# Patient Record
Sex: Male | Born: 1960 | Race: White | Hispanic: No | Marital: Single | Smoking: Current every day smoker
Health system: Southern US, Community
[De-identification: ages and names within clinical notes are randomized; demographics above are authoritative.]

## PROBLEM LIST (undated history)

## (undated) DIAGNOSIS — M1990 Unspecified osteoarthritis, unspecified site: Secondary | ICD-10-CM

## (undated) DIAGNOSIS — G8929 Other chronic pain: Secondary | ICD-10-CM

## (undated) DIAGNOSIS — J9819 Other pulmonary collapse: Secondary | ICD-10-CM

## (undated) DIAGNOSIS — M549 Dorsalgia, unspecified: Secondary | ICD-10-CM

## (undated) HISTORY — PX: APPENDECTOMY: SHX54

---

## 2008-12-29 ENCOUNTER — Emergency Department (HOSPITAL_COMMUNITY): Admission: EM | Admit: 2008-12-29 | Discharge: 2008-12-29 | Payer: Self-pay | Admitting: Emergency Medicine

## 2009-05-19 ENCOUNTER — Emergency Department (HOSPITAL_COMMUNITY): Admission: EM | Admit: 2009-05-19 | Discharge: 2009-05-19 | Payer: Self-pay | Admitting: Emergency Medicine

## 2009-08-05 ENCOUNTER — Emergency Department (HOSPITAL_COMMUNITY): Admission: EM | Admit: 2009-08-05 | Discharge: 2009-08-05 | Payer: Self-pay | Admitting: Emergency Medicine
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
## 2013-04-22 ENCOUNTER — Encounter (HOSPITAL_COMMUNITY): Payer: Self-pay | Admitting: Emergency Medicine

## 2013-04-22 ENCOUNTER — Emergency Department (HOSPITAL_COMMUNITY)
Admission: EM | Admit: 2013-04-22 | Discharge: 2013-04-22 | Disposition: A | Discharge status: Home / Self Care | Payer: Self-pay | Attending: Emergency Medicine | Admitting: Emergency Medicine

## 2013-04-22 DIAGNOSIS — F17200 Nicotine dependence, unspecified, uncomplicated: Secondary | ICD-10-CM | POA: Insufficient documentation

## 2013-04-22 DIAGNOSIS — H5789 Other specified disorders of eye and adnexa: Secondary | ICD-10-CM | POA: Insufficient documentation

## 2013-04-22 DIAGNOSIS — H109 Unspecified conjunctivitis: Secondary | ICD-10-CM | POA: Insufficient documentation

## 2013-04-22 DIAGNOSIS — Z8709 Personal history of other diseases of the respiratory system: Secondary | ICD-10-CM | POA: Insufficient documentation

## 2013-04-22 DIAGNOSIS — Z8739 Personal history of other diseases of the musculoskeletal system and connective tissue: Secondary | ICD-10-CM | POA: Insufficient documentation

## 2013-04-22 HISTORY — DX: Other pulmonary collapse: J98.19

## 2013-04-22 HISTORY — DX: Unspecified osteoarthritis, unspecified site: M19.90

## 2013-04-22 MED ORDER — TOBRAMYCIN 0.3 % OP SOLN: 2.0000 [drp] | OPHTHALMIC | Status: DC

## 2013-04-22 MED ORDER — TOBRAMYCIN 0.3 % OP SOLN
2.0000 [drp] | OPHTHALMIC | Status: DC
  Administered 2013-04-22: 2 [drp] via OPHTHALMIC
  Filled 2013-04-22: qty 5

## 2013-04-22 NOTE — ED Provider Notes (Signed)
CSN: [id number]     Arrival date & time 04/22/13  4098 History  This chart was scribed for Terrence Hutching, MD by Bennett Scrape, ED Scribe. This patient was seen in room APA06/APA06 and the patient's care was started at 8:32 AM.   Chief Complaint  Patient presents with  . Eye Pain    The history is provided by the patient. No language interpreter was used.    HPI Comments: CHARLTON BOULE is a 52 y.o. male who presents to the Emergency Department complaining of gradual onset, gradually worsening, constant right eye pain that started last night with associated redness, swelling and drainage described as crusting over this morning. He states that he cleared the crusting off with a warm compress with improvement. 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 He denies any recent insect bites. He reports one prior episode that occurred one month ago that resolved on its own. He denies any photophobia or visual changes as associated symptoms.    Past Medical History  Diagnosis Date  . Arthritis   . Collapsed lung    Past Surgical History  Procedure Laterality Date  . Appendectomy     History reviewed. No pertinent family history. History  Substance Use Topics  . Smoking status: Current Every Day Smoker -- 1.00 packs/day    Types: Cigarettes  . Smokeless tobacco: Not on file  . Alcohol Use: No    Review of Systems  A complete 10 system review of systems was obtained and all systems are negative except as noted in the HPI and PMH.   Allergies  Review of patient's allergies indicates no known allergies.  Home Medications  No current outpatient prescriptions on file.  Triage Vitals: BP 137/84  Pulse 60  Temp(Src) 97.9 F (36.6 C)  Resp 16  Ht 5\' 10"  (1.778 m)  Wt 175 lb (79.379 kg)  BMI 25.11 kg/m2  SpO2 100%  Physical Exam  Nursing note and vitals reviewed. Constitutional: He is oriented to person, place,  and time. He appears well-developed and well-nourished. No distress.  HENT:  Head: Normocephalic and atraumatic.  Eyes:  Right upper eye lid is erythematous with drainage, injected conjunctivae of the right eye  Neck: Neck supple. No tracheal deviation present.  Cardiovascular: Normal rate.   Pulmonary/Chest: Effort normal. No respiratory distress.  Musculoskeletal: Normal range of motion.  Neurological: He is alert and oriented to person, place, and time.  Skin: Skin is warm and dry.  Psychiatric: He has a normal mood and affect. His behavior is normal.    ED Course  Procedures (including critical care time)  DIAGNOSTIC STUDIES: Oxygen Saturation is 100% on room air, normal by my interpretation.    COORDINATION OF CARE: 8:35 AM-Informed pt of pink eye diagnosis. Discussed discharge plan which includes antibiotic eye drops x4 days with pt and pt agreed to plan. Also advised pt to flush eye out with tap water and to follow hygiene protocol to avoid spreading the symptoms to family members as needed and pt agreed. Addressed symptoms to return for with pt.   Labs Review Labs Reviewed - No data to display Imaging Review No results found.  EKG Interpretation   None       MDM  No diagnosis found. History and physical consistent with conjunctivitis. Rx tobramycin eyedrops  I personally performed the services described in this documentation, which was scribed in my presence. The recorded  information has been reviewed and is accurate.    Terrence Hutching, MD 04/22/13 [phone]

## 2013-04-22 NOTE — ED Notes (Signed)
Woke this morning w/R eye pain and crusted shut.  Used warm compress to clean.  Continues to have drainage, does not know color or consistency. Swelling and redness noted to lower lateral lid.

## 2013-04-22 NOTE — Progress Notes (Signed)
ED/CM noted patient did not have health insurance and/or PCP listed in the computer.  Patient was given the [place] resource handout with information on the clinics, food pantries, and the handout for new health insurance sign-up.  Patient expressed appreciation for this. 

## 2013-07-09 ENCOUNTER — Encounter (HOSPITAL_COMMUNITY): Payer: Self-pay | Admitting: Emergency Medicine

## 2013-07-09 ENCOUNTER — Emergency Department (HOSPITAL_COMMUNITY)
Admission: EM | Admit: 2013-07-09 | Discharge: 2013-07-09 | Disposition: A | Discharge status: Home / Self Care | Payer: Self-pay | Attending: Emergency Medicine | Admitting: Emergency Medicine

## 2013-07-09 DIAGNOSIS — Y9389 Activity, other specified: Secondary | ICD-10-CM | POA: Insufficient documentation

## 2013-07-09 DIAGNOSIS — Y929 Unspecified place or not applicable: Secondary | ICD-10-CM | POA: Insufficient documentation

## 2013-07-09 DIAGNOSIS — IMO0002 Reserved for concepts with insufficient information to code with codable children: Secondary | ICD-10-CM | POA: Insufficient documentation

## 2013-07-09 DIAGNOSIS — Z792 Long term (current) use of antibiotics: Secondary | ICD-10-CM | POA: Insufficient documentation

## 2013-07-09 DIAGNOSIS — M5430 Sciatica, unspecified side: Secondary | ICD-10-CM | POA: Insufficient documentation

## 2013-07-09 DIAGNOSIS — F17200 Nicotine dependence, unspecified, uncomplicated: Secondary | ICD-10-CM | POA: Insufficient documentation

## 2013-07-09 DIAGNOSIS — G8921 Chronic pain due to trauma: Secondary | ICD-10-CM | POA: Insufficient documentation

## 2013-07-09 DIAGNOSIS — Z8739 Personal history of other diseases of the musculoskeletal system and connective tissue: Secondary | ICD-10-CM | POA: Insufficient documentation

## 2013-07-09 DIAGNOSIS — X500XXA Overexertion from strenuous movement or load, initial encounter: Secondary | ICD-10-CM | POA: Insufficient documentation

## 2013-07-09 DIAGNOSIS — Z8709 Personal history of other diseases of the respiratory system: Secondary | ICD-10-CM | POA: Insufficient documentation

## 2013-07-09 DIAGNOSIS — M5431 Sciatica, right side: Secondary | ICD-10-CM

## 2013-07-09 HISTORY — DX: Other chronic pain: G89.29

## 2013-07-09 HISTORY — DX: Dorsalgia, unspecified: M54.9

## 2013-07-09 MED ORDER — METHOCARBAMOL 500 MG PO TABS: 500.0000 mg | ORAL_TABLET | Freq: Two times a day (BID) | ORAL | Status: DC

## 2013-07-09 MED ORDER — MELOXICAM 7.5 MG PO TABS: 7.5000 mg | ORAL_TABLET | Freq: Every day | ORAL | Status: DC

## 2013-07-09 NOTE — ED Provider Notes (Signed)
Medical screening examination/treatment/procedure(s) were performed by non-physician practitioner and as supervising physician I was immediately available for consultation/collaboration.  EKG Interpretation   None         Gudelia Eugene W. Ranferi Clingan, MD 07/09/13 2117 

## 2013-07-09 NOTE — ED Notes (Signed)
Pt took ibuprofen last night and aleve this morning
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
## 2013-07-09 NOTE — ED Notes (Signed)
Pt reports chronic back pain, but pain is worse since lifting an ac unit yesterday.

## 2016-01-27 ENCOUNTER — Emergency Department (HOSPITAL_COMMUNITY)
Admission: EM | Admit: 2016-01-27 | Discharge: 2016-01-27 | Disposition: A | Discharge status: Home / Self Care | Payer: BLUE CROSS/BLUE SHIELD | Attending: Emergency Medicine | Admitting: Emergency Medicine

## 2016-01-27 ENCOUNTER — Encounter (HOSPITAL_COMMUNITY): Payer: Self-pay | Admitting: Emergency Medicine

## 2016-01-27 DIAGNOSIS — Y999 Unspecified external cause status: Secondary | ICD-10-CM | POA: Diagnosis not present

## 2016-01-27 DIAGNOSIS — Z79899 Other long term (current) drug therapy: Secondary | ICD-10-CM | POA: Diagnosis not present

## 2016-01-27 DIAGNOSIS — Y939 Activity, unspecified: Secondary | ICD-10-CM | POA: Diagnosis not present

## 2016-01-27 DIAGNOSIS — W19XXXA Unspecified fall, initial encounter: Secondary | ICD-10-CM | POA: Diagnosis not present

## 2016-01-27 DIAGNOSIS — M62830 Muscle spasm of back: Secondary | ICD-10-CM | POA: Diagnosis not present

## 2016-01-27 DIAGNOSIS — Y929 Unspecified place or not applicable: Secondary | ICD-10-CM | POA: Insufficient documentation

## 2016-01-27 DIAGNOSIS — M545 Low back pain, unspecified: Secondary | ICD-10-CM

## 2016-01-27 DIAGNOSIS — F17210 Nicotine dependence, cigarettes, uncomplicated: Secondary | ICD-10-CM | POA: Insufficient documentation

## 2016-01-27 MED ORDER — METHOCARBAMOL 500 MG PO TABS: 500.0000 mg | ORAL_TABLET | Freq: Two times a day (BID) | ORAL | 0 refills | Status: DC

## 2016-01-27 NOTE — ED Triage Notes (Signed)
Fell last Thursday. Injury to lower back, rates pain 7/10.  Denies any other injuries at this time.

## 2016-01-27 NOTE — ED Provider Notes (Signed)
[facility]-EM[facility] DEPT Provider Note   CSN: [id number] Arrival date & time: 01/27/16  1304  First Provider Contact:  None       History   Chief Complaint Chief Complaint  Patient presents with  . Fall    HPI Terrence Perez is a 55 y.o. male.  Patient presents to the ED with a chief complaint of low back pain.  States that he fell after tripping on a step and jarred his back about a week ago.  He states that he has had some spasms of his low back muscles.  He reports increased pain with movement and palpation.  He has tried using ice, heat, and NSAIDs with some relief.  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  He denies any numbness, weakness, or tingling.  Denies any bowel or bladder incontinence.   The history is provided by the patient. No language interpreter was used.    Past Medical History:  Diagnosis Date  . Arthritis   . Chronic back pain   . Collapsed lung     There are no active problems to display for this patient.   Past Surgical History:  Procedure Laterality Date  . APPENDECTOMY         Home Medications    Prior to Admission medications   Medication Sig Start Date End Date Taking? Authorizing Provider  meloxicam (MOBIC) 7.5 MG tablet Take 1 tablet (7.5 mg total) by mouth daily. 07/09/13   Hope Orlene Och, NP  methocarbamol (ROBAXIN) 500 MG tablet Take 1 tablet (500 mg total) by mouth 2 (two) times daily. 07/09/13   Hope Orlene Och, NP  tobramycin (TOBREX) 0.3 % ophthalmic solution Place 2 drops into the right eye every 4 (four) hours. 04/22/13   Donnetta Hutching, MD    Family History No family history on file.  Social History Social History  Substance Use Topics  . Smoking status: Current Every Day Smoker    Packs/day: 1.00    Types: Cigarettes  . Smokeless tobacco: Never Used  . Alcohol use No     Allergies   Review of patient's allergies indicates no known allergies.   Review of Systems Review of Systems    Constitutional: Negative for chills and fever.  Gastrointestinal:       No bowel incontinence  Genitourinary:       No urinary incontinence  Musculoskeletal: Positive for arthralgias, back pain and myalgias.  Neurological:       No saddle anesthesia  All other systems reviewed and are negative.    Physical Exam Updated Vital Signs BP 123/79 (BP Location: Left Arm)   Pulse 64   Temp 97.8 F (36.6 C) (Oral)   Resp 16   Ht 5\' 11"  (1.803 m)   Wt 70.8 kg   SpO2 97%   BMI 21.76 kg/m   Physical Exam Physical Exam  Constitutional: Pt appears well-developed and well-nourished. No distress.  HENT:  Head: Normocephalic and atraumatic.  Mouth/Throat: Oropharynx is clear and moist. No oropharyngeal exudate.  Eyes: Conjunctivae are normal.  Neck: Normal range of motion. Neck supple.  No meningismus Cardiovascular: Normal rate, regular rhythm and intact distal pulses.   Pulmonary/Chest: Effort normal and breath sounds normal. No respiratory distress. Pt has no wheezes.  Abdominal: Pt exhibits no distension Musculoskeletal:  Bilateral lumbar paraspinal muscles tender to palpation, no bony CTLS spine tenderness, deformity, step-off, or crepitus Lymphadenopathy: Pt has no cervical adenopathy.  Neurological: Pt is alert and  oriented Speech is clear and goal oriented, follows commands Normal 5/5 strength in upper and lower extremities bilaterally including dorsiflexion and plantar flexion, strong and equal grip strength Sensation intact Great toe extension intact Moves extremities without ataxia, coordination intact Normal gait Normal balance  Skin: Skin is warm and dry. No rash noted. Pt is not diaphoretic. No erythema.  Psychiatric: Pt has a normal mood and affect. Behavior is normal.  Nursing note and vitals reviewed.   ED Treatments / Results  Labs (all labs ordered are listed, but only abnormal results are displayed) Labs Reviewed - No data to display  EKG  EKG  Interpretation None       Radiology No results found.  Procedures Procedures (including critical care time)  Medications Ordered in ED Medications - No data to display   Initial Impression / Assessment and Plan / ED Course  I have reviewed the triage vital signs and the nursing notes.  Pertinent labs & imaging results that were available during my care of the patient were reviewed by me and considered in my medical decision making (see chart for details).  Clinical Course    Patient with back pain.  No neurological deficits and normal neuro exam.  Patient is ambulatory.  No loss of bowel or bladder control.  Doubt cauda equina.  Denies fever,  doubt epidural abscess or other lesion. Recommend back exercises, stretching, RICE, and will treat with a short course of robaxin.  Encouraged the patient that there could be a need for additional workup and/or imaging such as MRI, if the symptoms do not resolve. Patient advised that if the back pain does not resolve, or radiates, this could progress to more serious conditions and is encouraged to follow-up with PCP or orthopedics within 2 weeks.     Final Clinical Impressions(s) / ED Diagnoses   Final diagnoses:  Muscle spasm of back  Bilateral low back pain without sciatica    New Prescriptions Current Discharge Medication List       Roxy Horseman, PA-C 01/27/16 1355    Samuel Jester, DO 01/29/16 2125

## 2016-07-01 ENCOUNTER — Encounter (HOSPITAL_COMMUNITY): Payer: Self-pay | Admitting: Emergency Medicine

## 2016-07-01 ENCOUNTER — Emergency Department (HOSPITAL_COMMUNITY)
Admission: EM | Admit: 2016-07-01 | Discharge: 2016-07-01 | Disposition: A | Discharge status: Home / Self Care | Payer: BLUE CROSS/BLUE SHIELD | Attending: Emergency Medicine | Admitting: Emergency Medicine

## 2016-07-01 DIAGNOSIS — B349 Viral infection, unspecified: Secondary | ICD-10-CM | POA: Insufficient documentation

## 2016-07-01 DIAGNOSIS — Z7982 Long term (current) use of aspirin: Secondary | ICD-10-CM | POA: Insufficient documentation

## 2016-07-01 DIAGNOSIS — F17210 Nicotine dependence, cigarettes, uncomplicated: Secondary | ICD-10-CM | POA: Insufficient documentation

## 2016-07-01 NOTE — ED Triage Notes (Signed)
Onset 3 days ago, generalized body aches, chills, headache, cough non productive

## 2016-07-01 NOTE — Discharge Instructions (Signed)
Increase fluids, Tylenol or ibuprofen, good handwashing. Stay away from your wife.

## 2016-07-01 NOTE — ED Provider Notes (Signed)
[facility] DEPT Provider Note   CSN: [id number] Arrival date & time: 07/01/16  [id number]  By signing my name below, I, Clarisse GougeXavier Herndon, attest that this documentation has been prepared under the direction and in the presence of Donnetta HutchingBrian Aydian Dimmick, MD. Electronically signed, Clarisse GougeXavier Herndon, ED Scribe. 07/01/16. 9:45 AM.    History   Chief Complaint Chief Complaint  Patient presents with  . Generalized Body Aches   The history is provided by the patient and medical records. No language interpreter was used.    HPI Comments: Opal SidlesMark S Somera is a 55 y.o. male who presents to the Emergency Department complaining of chills and generalized body aches x 4 days. He notes subjective fever, cough, postnasal drainage, diarrhea and ear popping with cough. States he has taken Tylenol at home to treat symptoms with minimal relief. 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 He notes he is ambulatory and able to drink fluids. Pt is a smoker. He did not receive a flu shot this year. 1 sick contact at home (spouse). Denies urinary issues.    Past Medical History:  Diagnosis Date  . Arthritis   . Chronic back pain   . Collapsed lung     There are no active problems to display for this patient.   Past Surgical History:  Procedure Laterality Date  . APPENDECTOMY         Home Medications    Prior to Admission medications   Medication Sig Start Date End Date Taking? Authorizing Provider  acetaminophen (TYLENOL) 500 MG tablet Take 2,000 mg by mouth every 6 (six) hours as needed for mild pain, moderate pain or headache.   Yes Historical Provider, MD  aspirin EC 81 MG tablet Take 81 mg by mouth daily.   Yes Historical Provider, MD    Family History No family history on file.  Social History Social History  Substance Use Topics  . Smoking status: Current Every Day Smoker    Packs/day: 1.00    Types: Cigarettes  . Smokeless tobacco: Never Used   . Alcohol use No     Allergies   Patient has no known allergies.   Review of Systems Review of Systems  All other systems reviewed and are negative.  A complete 10 system review of systems was obtained and all systems are negative except as noted in the HPI and PMH.    Physical Exam Updated Vital Signs BP 130/95 (BP Location: Left Arm)   Pulse 68   Temp 97.8 F (36.6 C) (Oral)   Resp 19   Ht 5\' 10"  (1.778 m)   Wt 176 lb (79.8 kg)   SpO2 100%   BMI 25.25 kg/m   Physical Exam  Constitutional: He is oriented to person, place, and time. He appears well-developed and well-nourished.  HENT:  Head: Normocephalic and atraumatic.  Eyes: Conjunctivae are normal.  Neck: Neck supple.  Cardiovascular: Normal rate and regular rhythm.   Pulmonary/Chest: Effort normal and breath sounds normal.  Abdominal: Soft. Bowel sounds are normal.  Musculoskeletal: Normal range of motion.  Neurological: He is alert and oriented to person, place, and time.  Skin: Skin is warm and dry.  Psychiatric: He has a normal mood and affect. His behavior is normal.  Nursing note and vitals reviewed.    ED Treatments / Results  DIAGNOSTIC STUDIES: Oxygen Saturation is 100% on RA, normal by my interpretation.    COORDINATION OF CARE: 10:15 AM Discussed  treatment plan with pt at bedside and pt agreed to plan.  Labs (all labs ordered are listed, but only abnormal results are displayed) Labs Reviewed - No data to display  EKG  EKG Interpretation None       Radiology No results found.  Procedures Procedures (including critical care time)  Medications Ordered in ED Medications - No data to display   Initial Impression / Assessment and Plan / ED Course  I have reviewed the triage vital signs and the nursing notes.  Pertinent labs & imaging results that were available during my care of the patient were reviewed by me and considered in my medical decision making (see chart for  details).  Discussed symptoms as likely viral illness. Pt advised to treat symptoms with fluids, tylenol, over-the-counter cold and flu medication, and Mucinex. Advised to wash hands regularly and avoid contact with wife at home until symptoms improve.   Clinical Course     History and physical most consistent with viral syndrome. Fluids, Tylenol or ibuprofen.  Final Clinical Impressions(s) / ED Diagnoses   Final diagnoses:  Viral syndrome    New Prescriptions New Prescriptions   No medications on file  I personally performed the services described in this documentation, which was scribed in my presence. The recorded information has been reviewed and is accurate.     Donnetta HutchingBrian Meekah Math, MD 07/01/16 [phone]

## 2016-07-25 ENCOUNTER — Emergency Department (HOSPITAL_COMMUNITY)
Admission: EM | Admit: 2016-07-25 | Discharge: 2016-07-26 | Disposition: A | Discharge status: Home / Self Care | Payer: BLUE CROSS/BLUE SHIELD | Attending: Emergency Medicine | Admitting: Emergency Medicine

## 2016-07-25 ENCOUNTER — Encounter (HOSPITAL_COMMUNITY): Payer: Self-pay | Admitting: Emergency Medicine

## 2016-07-25 DIAGNOSIS — F17210 Nicotine dependence, cigarettes, uncomplicated: Secondary | ICD-10-CM | POA: Insufficient documentation

## 2016-07-25 DIAGNOSIS — M79603 Pain in arm, unspecified: Secondary | ICD-10-CM

## 2016-07-25 DIAGNOSIS — Z5321 Procedure and treatment not carried out due to patient leaving prior to being seen by health care provider: Secondary | ICD-10-CM | POA: Insufficient documentation

## 2016-07-25 DIAGNOSIS — Z7982 Long term (current) use of aspirin: Secondary | ICD-10-CM | POA: Insufficient documentation

## 2016-07-25 DIAGNOSIS — M79602 Pain in left arm: Secondary | ICD-10-CM | POA: Insufficient documentation

## 2016-07-25 NOTE — ED Triage Notes (Signed)
Pt c/o left arm pain from shoulder to elbow x 3 days. Pt denies numbness/tingling. Denies cp/neck. Pt [place]

## 2016-07-26 NOTE — ED Provider Notes (Signed)
Patient room empty on attempted evaluation.  BP cuff on bed.  Patient left without being seen.    Glynn OctaveStephen Zayvon Alicea, MD 07/26/16 [phone]

## 2023-03-18 ENCOUNTER — Encounter (HOSPITAL_COMMUNITY): Payer: Self-pay

## 2023-03-18 ENCOUNTER — Other Ambulatory Visit: Payer: Self-pay

## 2023-03-18 ENCOUNTER — Emergency Department (HOSPITAL_COMMUNITY): Payer: BLUE CROSS/BLUE SHIELD

## 2023-03-18 ENCOUNTER — Emergency Department (HOSPITAL_COMMUNITY)
Admission: EM | Admit: 2023-03-18 | Discharge: 2023-03-18 | Discharge status: Against Medical Advice | Payer: BLUE CROSS/BLUE SHIELD | Attending: Emergency Medicine | Admitting: Emergency Medicine

## 2023-03-18 DIAGNOSIS — Z7982 Long term (current) use of aspirin: Secondary | ICD-10-CM | POA: Insufficient documentation

## 2023-03-18 DIAGNOSIS — Z20822 Contact with and (suspected) exposure to covid-19: Secondary | ICD-10-CM | POA: Insufficient documentation

## 2023-03-18 DIAGNOSIS — E860 Dehydration: Secondary | ICD-10-CM | POA: Insufficient documentation

## 2023-03-18 DIAGNOSIS — I492 Junctional premature depolarization: Secondary | ICD-10-CM | POA: Insufficient documentation

## 2023-03-18 DIAGNOSIS — Z5329 Procedure and treatment not carried out because of patient's decision for other reasons: Secondary | ICD-10-CM | POA: Insufficient documentation

## 2023-03-18 DIAGNOSIS — I498 Other specified cardiac arrhythmias: Secondary | ICD-10-CM

## 2023-03-18 DIAGNOSIS — R55 Syncope and collapse: Secondary | ICD-10-CM | POA: Insufficient documentation

## 2023-03-18 LAB — BASIC METABOLIC PANEL
Anion gap: 6 (ref 5–15)
BUN: 12 mg/dL (ref 6–20)
CO2: 24 mmol/L (ref 22–32)
Calcium: 7.7 mg/dL — ABNORMAL LOW (ref 8.9–10.3)
Chloride: 103 mmol/L (ref 98–111)
Creatinine, Ser: 0.96 mg/dL (ref 0.61–1.24)
GFR, Estimated: >60 mL/min (ref >60)
Glucose, Bld: 109 mg/dL — ABNORMAL HIGH (ref 70–99)
Potassium: 4.2 mmol/L (ref 3.5–5.1)
Sodium: 133 mmol/L — ABNORMAL LOW (ref 135–145)

## 2023-03-18 LAB — RESP PANEL BY RT-PCR (RSV, FLU A&B, COVID)  RVPGX2
Influenza A by PCR: NEGATIVE
Influenza B by PCR: NEGATIVE
Resp Syncytial Virus by PCR: NEGATIVE
SARS Coronavirus 2 by RT PCR: NEGATIVE

## 2023-03-18 LAB — CBC WITH DIFFERENTIAL/PLATELET
Abs Immature Granulocytes: 0.05 10*3/uL (ref 0.00–0.07)
Basophils Absolute: 0.1 10*3/uL (ref 0.0–0.1)
Basophils Relative: 1 %
Eosinophils Absolute: 0.1 10*3/uL (ref 0.0–0.5)
Eosinophils Relative: 1 %
HCT: 39.2 % (ref 39.0–52.0)
Hemoglobin: 13 g/dL (ref 13.0–17.0)
Immature Granulocytes: 1 %
Lymphocytes Relative: 14 %
Lymphs Abs: 1.4 10*3/uL (ref 0.7–4.0)
MCH: 32.4 pg (ref 26.0–34.0)
MCHC: 33.2 g/dL (ref 30.0–36.0)
MCV: 97.8 fL (ref 80.0–100.0)
Monocytes Absolute: 0.5 10*3/uL (ref 0.1–1.0)
Monocytes Relative: 4 %
Neutro Abs: 8.1 10*3/uL — ABNORMAL HIGH (ref 1.7–7.7)
Neutrophils Relative %: 79 %
Platelets: 257 10*3/uL (ref 150–400)
RBC: 4.01 MIL/uL — ABNORMAL LOW (ref 4.22–5.81)
RDW: 12.2 % (ref 11.5–15.5)
WBC: 10.2 10*3/uL (ref 4.0–10.5)
nRBC: 0 % (ref 0.0–0.2)

## 2023-03-18 LAB — MAGNESIUM: Magnesium: 1.9 mg/dL (ref 1.7–2.4)

## 2023-03-18 LAB — TROPONIN I (HIGH SENSITIVITY)
Troponin I (High Sensitivity): 2 ng/L (ref <18)
Troponin I (High Sensitivity): 4 ng/L (ref <18)

## 2023-03-18 LAB — CK: Total CK: 93 U/L (ref 49–397)

## 2023-03-18 MED ORDER — SODIUM CHLORIDE 0.9 % IV BOLUS
1000.0000 mL | Freq: Once | INTRAVENOUS | Status: AC
  Administered 2023-03-18: 1000 mL via INTRAVENOUS

## 2023-03-18 NOTE — ED Triage Notes (Signed)
BIB [facility] from scene s/p syncopal episode. 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 While sitting down he had a syncopal episode and fell from sitting. Hit L temporal head with bruising contusion abrasion noted. Had another near-syncopal episode walking inside. BS 114. Has had only coffee and Mt. Dew soda this am. Has not eaten. Initial BS 114. Initial sBP 70. BPs 82/46, up to 92/59 after 700cc NS IVF. Denies recent illness. Endorses "feeling better after IVF. NSR/ SB with HR 50. IV 18g, L AC. Arrives alert, NAD, calm, interactive, skin W&D, resps e/u.

## 2023-03-18 NOTE — ED Provider Notes (Signed)
[place] EMERGENCY DEPARTMENT AT [facility] Provider Note   CSN: [id number] Arrival date & time: 03/18/23  1358     History Chief Complaint  Patient presents with   Loss of Consciousness    Terrence Perez is a 62 y.o. male.  Patient without significant past medical history presents the emergency department concerns of syncopal episode.  Patient was reportedly on a lawnmower about to start mowing when he began to feel hot and diaphoretic and sat down.  Does not recall really what else happened after that but he woke up with paramedics being transported in the emergency department.  Reports head strike to the left temporal aspect of his head but denies any obvious pain or injury elsewhere.  Patient reports that he only had coffee Monday of this morning and denies anything to eat.  Patient initially had hypotension and was administered fluids by EMS with improvement in blood pressures.  Otherwise denies any significant past medical history such as cardiac abnormalities such as arrhythmias.  Currently denies any shortness of breath or hemoptysis.  Loss of Consciousness      Home Medications Prior to Admission medications   Medication Sig Start Date End Date Taking? Authorizing Provider  acetaminophen (TYLENOL) 500 MG tablet Take 2,000 mg by mouth every 6 (six) hours as needed for mild pain, moderate pain or headache.    [provider]  aspirin EC 81 MG tablet Take 81 mg by mouth daily.    [provider]      Allergies    Patient has no known allergies.    Review of Systems   Review of Systems  Cardiovascular:  Positive for syncope.  Neurological:  Positive for syncope.  All other systems reviewed and are negative.   Physical Exam Updated Vital Signs BP (!) 141/90   Pulse (!) 59   Temp (!) 97.4 F (36.3 C) (Oral)   Resp 14   Ht 5\' 10"  (1.778 m)   Wt 74.8 kg   SpO2 97%   BMI 23.68 kg/m  Physical Exam Vitals and nursing note reviewed.   Constitutional:      General: He is not in acute distress.    Appearance: He is well-developed.  HENT:     Head: Normocephalic and atraumatic.  Eyes:     Conjunctiva/sclera: Conjunctivae normal.  Cardiovascular:     Rate and Rhythm: Normal rate and regular rhythm.     Heart sounds: No murmur heard. Pulmonary:     Effort: Pulmonary effort is normal. No respiratory distress.     Breath sounds: Normal breath sounds.  Abdominal:     Palpations: Abdomen is soft.     Tenderness: There is no abdominal tenderness.  Musculoskeletal:        General: No swelling.     Cervical back: Neck supple.  Skin:    General: Skin is warm and dry.     Capillary Refill: Capillary refill takes less than 2 seconds.  Neurological:     General: No focal deficit present.     Mental Status: He is alert. Mental status is at baseline.     Cranial Nerves: No cranial nerve deficit.  Psychiatric:        Mood and Affect: Mood normal.     ED Results / Procedures / Treatments   Labs (all labs ordered are listed, but only abnormal results are displayed) Labs Reviewed  BASIC METABOLIC PANEL - Abnormal; Notable for the following components:  Result Value   Sodium 133 (*)    Glucose, Bld 109 (*)    Calcium 7.7 (*)    All other components within normal limits  CBC WITH DIFFERENTIAL/PLATELET - Abnormal; Notable for the following components:   RBC 4.01 (*)    Neutro Abs 8.1 (*)    All other components within normal limits  RESP PANEL BY RT-PCR (RSV, FLU A&B, COVID)  RVPGX2  MAGNESIUM  CK  TROPONIN I (HIGH SENSITIVITY)  TROPONIN I (HIGH SENSITIVITY)    EKG EKG Interpretation Date/Time:  Saturday March 18 2023 14:15:29 EDT Ventricular Rate:  52 PR Interval:    QRS Duration:  89 QT Interval:  446 QTC Calculation: 415 R Axis:   82  Text Interpretation: Junctional rhythm Borderline right axis deviation junctional new from prior 1/18 Confirmed by Meridee Score [phone]) on 03/18/2023 2:22:45  PM  Radiology DG Chest 2 View  Result Date: 03/18/2023 CLINICAL DATA:  Syncope.  Fell from a sitting position. EXAM: CHEST - 2 VIEW COMPARISON:  None Available. FINDINGS: Cardiac silhouette is normal in size and configuration. No mediastinal or hilar masses. No evidence of adenopathy. Clear lungs.  No pleural effusion or pneumothorax. Skeletal structures are intact. IMPRESSION: No active cardiopulmonary disease. Electronically Signed   By: Amie Portland M.D.   On: 03/18/2023 16:22   CT Head Wo Contrast  Result Date: 03/18/2023 CLINICAL DATA:  Syncopal episode. Fall from sitting. Left temporal contusion. EXAM: CT HEAD WITHOUT CONTRAST CT CERVICAL SPINE WITHOUT CONTRAST TECHNIQUE: Multidetector CT imaging of the head and cervical spine was performed following the standard protocol without intravenous contrast. Multiplanar CT image reconstructions of the cervical spine were also generated. RADIATION DOSE REDUCTION: This exam was performed according to the departmental dose-optimization program which includes automated exposure control, adjustment of the mA and/or kV according to patient size and/or use of iterative reconstruction technique. COMPARISON:  Cervical radiographs, 08/05/2009. FINDINGS: CT HEAD FINDINGS Brain: No evidence of acute infarction, hemorrhage, hydrocephalus, extra-axial collection or mass lesion/mass effect. Vascular: No hyperdense vessel or unexpected calcification. Skull: Normal. Negative for fracture or focal lesion. Sinuses/Orbits: Globes and orbits are unremarkable. Sinuses are clear. Other: Small amount of nonspecific soft tissue air seen in the lower forehead and near the top of the nose. CT CERVICAL SPINE FINDINGS Alignment: Normal. Skull base and vertebrae: There is a smooth aligned across the a 7 anterior endplate osteophytes, lower endplate of C5, which could reflect a fracture. However, there is no associated soft tissue swelling. This is suspected to be chronic. No other  evidence of a fracture.  No bone lesion. Soft tissues and spinal canal: No prevertebral fluid or swelling. No visible canal hematoma. Disc levels: Moderate loss of disc height at C5-C6 and C6-C7 with endplate spurring and sclerosis and mild disc bulging. Remaining cervical disc spaces well preserved. No evidence of a disc herniation. Upper chest: No acute findings. Mild paraseptal emphysema at the lung apices. Other: None. IMPRESSION: HEAD CT 1. No intracranial abnormality. 2. No skull fracture CERVICAL CT 1. Possible fracture along the anterior inferior endplate of C5 where there is anterior spurring. This is suspected to be a chronic finding. There is no associated soft tissue swelling. This warrants clinical correlation. If additional characterization is desired, cervical MRI would be recommended to assess for associated bone marrow edema in the setting of acute fracture 2. No other evidence of a fracture or acute abnormality Electronically Signed   By: Amie Portland M.D.   On: 03/18/2023 16:13  CT Cervical Spine Wo Contrast  Result Date: 03/18/2023 CLINICAL DATA:  Syncopal episode. Fall from sitting. Left temporal contusion. EXAM: CT HEAD WITHOUT CONTRAST CT CERVICAL SPINE WITHOUT CONTRAST TECHNIQUE: Multidetector CT imaging of the head and cervical spine was performed following the standard protocol without intravenous contrast. Multiplanar CT image reconstructions of the cervical spine were also generated. RADIATION DOSE REDUCTION: This exam was performed according to the departmental dose-optimization program which includes automated exposure control, adjustment of the mA and/or kV according to patient size and/or use of iterative reconstruction technique. COMPARISON:  Cervical radiographs, 08/05/2009. FINDINGS: CT HEAD FINDINGS Brain: No evidence of acute infarction, hemorrhage, hydrocephalus, extra-axial collection or mass lesion/mass effect. Vascular: No hyperdense vessel or unexpected calcification.  Skull: Normal. Negative for fracture or focal lesion. Sinuses/Orbits: Globes and orbits are unremarkable. Sinuses are clear. Other: Small amount of nonspecific soft tissue air seen in the lower forehead and near the top of the nose. CT CERVICAL SPINE FINDINGS Alignment: Normal. Skull base and vertebrae: There is a smooth aligned across the a 7 anterior endplate osteophytes, lower endplate of C5, which could reflect a fracture. However, there is no associated soft tissue swelling. This is suspected to be chronic. No other evidence of a fracture.  No bone lesion. Soft tissues and spinal canal: No prevertebral fluid or swelling. No visible canal hematoma. Disc levels: Moderate loss of disc height at C5-C6 and C6-C7 with endplate spurring and sclerosis and mild disc bulging. Remaining cervical disc spaces well preserved. No evidence of a disc herniation. Upper chest: No acute findings. Mild paraseptal emphysema at the lung apices. Other: None. IMPRESSION: HEAD CT 1. No intracranial abnormality. 2. No skull fracture CERVICAL CT 1. Possible fracture along the anterior inferior endplate of C5 where there is anterior spurring. This is suspected to be a chronic finding. There is no associated soft tissue swelling. This warrants clinical correlation. If additional characterization is desired, cervical MRI would be recommended to assess for associated bone marrow edema in the setting of acute fracture 2. No other evidence of a fracture or acute abnormality Electronically Signed   By: Amie Portland M.D.   On: 03/18/2023 16:13    Procedures Procedures   Medications Ordered in ED Medications  sodium chloride 0.9 % bolus 1,000 mL (0 mLs Intravenous Stopped 03/18/23 1538)    ED Course/ Medical Decision Making/ A&P                               Medical Decision Making Amount and/or Complexity of Data Reviewed Radiology: ordered.   This patient presents to the ED for concern of loss of consciousness.  Differential  diagnosis includes sepsis, COVID-19, pneumonia, PE, heart arrhythmia    Lab Tests:  I Ordered, and personally interpreted labs.  The pertinent results include: CBC largely unremarkable with only slight decrease in red blood count at 4.01, BMP with mild evidence of dehydration with sodium at 133 and calcium at 7.7, troponin negative at 2, CK normal at 93, magnesium at 1.9, respiratory viral panel negative   Imaging Studies ordered:  I ordered imaging studies including chest x-ray, CT head, CT cervical spine I independently visualized and interpreted imaging which showed no acute intracranial abnormality, no evidence of any cardiopulmonary process, possible fracture at C5 but no tenderness in this area send likely chronic in nature I agree with the radiologist interpretation   Medicines ordered and prescription drug management:  I ordered medication  including fluids for dehydration Reevaluation of the patient after these medicines showed that the patient improved I have reviewed the patients home medicines and have made adjustments as needed   Problem List / ED Course:  Patient presents the emergency department following a syncopal episode.  Patient was reportedly at a friend's house when he began to feel warm and diaphoretic when he was on a lawnmower.  He reports that he fell off and hit his head against a ramp on the left temporal aspect.  Currently endorsing mild headache but denies any nausea or vomiting.  No vision changes.  Denies any significant medical history and not currently on any medications except for Vraylar.  No recent alcohol use but does report that he smoked marijuana earlier today.  Did also endorse that he is have poor oral intake with only coffee Mountain Dew earlier today but no food.  Currently denies any chest pain, shortness of breath, or other concerning symptoms. Patient's EKG concerning for new found junctional rhythm. Discussed admission for further cardiac  evaluation as concern that this may be cause of patient's syncopal episode earlier today. Patient is currently refusing admission because he cannot miss work.  Discussed possibility that if patient's syncope was due to a heart arrhythmia, failure to have this further assessed or treated could ultimately result in worsening condition and up to death.  Patient is still refusing to be admitted for further evaluation.  Patient will be leaving AMA. Referral order placed for cardiology for evaluation given syncopal episode and new found junctional rhythm.  Discussed tricked return precautions with patient.  All questions answered prior to patient leaving AMA.   Final Clinical Impression(s) / ED Diagnoses Final diagnoses:  Syncope, unspecified syncope type  Junctional rhythm    Rx / DC Orders ED Discharge Orders          Ordered    Ambulatory referral to Cardiology       Comments: Syncopal episode. New junctional rhythm.   03/18/23 1921              Smitty Knudsen, PA-C 03/18/23 2326    Franne Forts, DO 03/19/23 2325

## 2023-03-18 NOTE — ED Notes (Signed)
Pt approached [facility] and said "I'm leaving, my ride is here." Pt provided information on the risks of leaving AMA, PA aware. Pt verbalized he is refusing to stay, and if the PA wants to send a referral to a cardiologist for him, then he can mail it to his address.

## 2023-03-18 NOTE — ED Notes (Signed)
Pt "ready to go", "wants to leave", "has called his ride", "wants IV out".
